# Patient Record
Sex: Female | Born: 1965
Health system: Southern US, Community
[De-identification: ages and names within clinical notes are randomized; demographics above are authoritative.]

## PROBLEM LIST (undated history)

## (undated) DIAGNOSIS — N920 Excessive and frequent menstruation with regular cycle: Secondary | ICD-10-CM

## (undated) DIAGNOSIS — D259 Leiomyoma of uterus, unspecified: Secondary | ICD-10-CM

## (undated) DIAGNOSIS — N946 Dysmenorrhea, unspecified: Secondary | ICD-10-CM

## (undated) HISTORY — DX: Leiomyoma of uterus, unspecified: D25.9

## (undated) HISTORY — DX: Dysmenorrhea, unspecified: N94.6

## (undated) HISTORY — DX: Excessive and frequent menstruation with regular cycle: N92.0

## (undated) HISTORY — PX: VAGINAL HYSTERECTOMY: SUR661

---

## 1998-09-17 ENCOUNTER — Other Ambulatory Visit: Admission: RE | Admit: 1998-09-17 | Discharge: 1998-09-17 | Payer: Self-pay | Admitting: Obstetrics and Gynecology

## 1999-05-13 ENCOUNTER — Other Ambulatory Visit: Admission: RE | Admit: 1999-05-13 | Discharge: 1999-05-13 | Payer: Self-pay | Admitting: Obstetrics and Gynecology

## 2000-02-15 ENCOUNTER — Other Ambulatory Visit: Admission: RE | Admit: 2000-02-15 | Discharge: 2000-02-15 | Payer: Self-pay | Admitting: Obstetrics and Gynecology

## 2000-12-12 ENCOUNTER — Other Ambulatory Visit: Admission: RE | Admit: 2000-12-12 | Discharge: 2000-12-12 | Payer: Self-pay | Admitting: Obstetrics and Gynecology

## 2001-11-22 ENCOUNTER — Encounter: Payer: Self-pay | Admitting: Family Medicine

## 2001-11-22 ENCOUNTER — Encounter: Admission: RE | Admit: 2001-11-22 | Discharge: 2001-11-22 | Payer: Self-pay | Admitting: Family Medicine

## 2001-12-21 ENCOUNTER — Other Ambulatory Visit: Admission: RE | Admit: 2001-12-21 | Discharge: 2001-12-21 | Payer: Self-pay | Admitting: Obstetrics and Gynecology

## 2002-11-09 ENCOUNTER — Ambulatory Visit (HOSPITAL_COMMUNITY): Admission: RE | Admit: 2002-11-09 | Discharge: 2002-11-09 | Payer: Self-pay | Admitting: Obstetrics and Gynecology

## 2002-11-09 ENCOUNTER — Encounter: Payer: Self-pay | Admitting: Obstetrics and Gynecology

## 2002-11-17 ENCOUNTER — Encounter: Payer: Self-pay | Admitting: Obstetrics and Gynecology

## 2002-11-17 ENCOUNTER — Ambulatory Visit (HOSPITAL_COMMUNITY): Admission: RE | Admit: 2002-11-17 | Discharge: 2002-11-17 | Payer: Self-pay | Admitting: Obstetrics and Gynecology

## 2003-03-05 ENCOUNTER — Observation Stay (HOSPITAL_COMMUNITY): Admission: RE | Admit: 2003-03-05 | Discharge: 2003-03-06 | Payer: Self-pay | Admitting: Interventional Radiology

## 2003-06-20 ENCOUNTER — Encounter: Admission: RE | Admit: 2003-06-20 | Discharge: 2003-06-20 | Payer: Self-pay | Admitting: Interventional Radiology

## 2003-08-21 ENCOUNTER — Other Ambulatory Visit: Admission: RE | Admit: 2003-08-21 | Discharge: 2003-08-21 | Payer: Self-pay | Admitting: Obstetrics and Gynecology

## 2003-08-28 ENCOUNTER — Encounter: Admission: RE | Admit: 2003-08-28 | Discharge: 2003-08-28 | Payer: Self-pay | Admitting: Obstetrics and Gynecology

## 2003-09-02 ENCOUNTER — Ambulatory Visit (HOSPITAL_COMMUNITY): Admission: RE | Admit: 2003-09-02 | Discharge: 2003-09-02 | Payer: Self-pay | Admitting: Interventional Radiology

## 2003-09-12 ENCOUNTER — Encounter: Admission: RE | Admit: 2003-09-12 | Discharge: 2003-09-12 | Payer: Self-pay | Admitting: Interventional Radiology

## 2004-07-09 ENCOUNTER — Other Ambulatory Visit: Admission: RE | Admit: 2004-07-09 | Discharge: 2004-07-09 | Payer: Self-pay | Admitting: Obstetrics and Gynecology

## 2004-10-12 ENCOUNTER — Encounter: Admission: RE | Admit: 2004-10-12 | Discharge: 2004-10-12 | Payer: Self-pay | Admitting: Obstetrics and Gynecology

## 2005-07-19 ENCOUNTER — Other Ambulatory Visit: Admission: RE | Admit: 2005-07-19 | Discharge: 2005-07-19 | Payer: Self-pay | Admitting: Obstetrics and Gynecology

## 2005-10-13 ENCOUNTER — Encounter: Admission: RE | Admit: 2005-10-13 | Discharge: 2005-10-13 | Payer: Self-pay | Admitting: Obstetrics and Gynecology

## 2006-02-01 ENCOUNTER — Encounter (INDEPENDENT_AMBULATORY_CARE_PROVIDER_SITE_OTHER): Payer: Self-pay | Admitting: *Deleted

## 2006-02-01 ENCOUNTER — Ambulatory Visit (HOSPITAL_COMMUNITY): Admission: RE | Admit: 2006-02-01 | Discharge: 2006-02-02 | Payer: Self-pay | Admitting: Obstetrics and Gynecology

## 2006-11-17 ENCOUNTER — Encounter: Admission: RE | Admit: 2006-11-17 | Discharge: 2006-11-17 | Payer: Self-pay | Admitting: Obstetrics and Gynecology

## 2008-04-03 ENCOUNTER — Encounter: Admission: RE | Admit: 2008-04-03 | Discharge: 2008-04-03 | Payer: Self-pay | Admitting: Obstetrics and Gynecology

## 2009-04-04 ENCOUNTER — Encounter: Admission: RE | Admit: 2009-04-04 | Discharge: 2009-04-04 | Payer: Self-pay | Admitting: Obstetrics and Gynecology

## 2010-07-03 NOTE — Op Note (Signed)
NAMESCHYLER, Patterson            ACCOUNT NO.:  [id number]   MEDICAL RECORD NO.:  [id number]          PATIENT TYPE:  AMB   LOCATION:  SDC                           FACILITY:  [facility]   PHYSICIAN:  Janine Limbo, M.D.DATE OF BIRTH:  [birth year]   DATE OF PROCEDURE:  02/01/2006  DATE OF DISCHARGE:                               OPERATIVE REPORT   PREOPERATIVE DIAGNOSES:  1. Fibroid uterus.  2. Menorrhagia  3. Dysmenorrhea.   POSTOPERATIVE DIAGNOSES:  1. Fibroid uterus.  2. Menorrhagia  3. Dysmenorrhea.   PROCEDURES:  1. Vaginal hysterectomy.  2. Uterine morcellation.   SURGEON:  Janine Limbo, M.D.   FIRST ASSISTANT:  Osborn Coho, MD.   ANESTHETIC:  General.   DISPOSITION:  Amy Patterson is a 46 year old female, para 1-1-0-2, who  presents with the above-mentioned diagnoses.  She understands her  indications for a vaginal hysterectomy and she accepts the risks of, but  not limited to, anesthetic complications, bleeding, infection, and  possible damage to the surrounding organs.   FINDINGS:  The patient was noted to have a 16-week size fibroid uterus  and there was a predominant fibroid located on the right anterior fundus  that measured approximately 10 cm in size.  The fallopian tubes were  normal except for defect from her prior tubal ligation.  The ovaries  appeared normal.   PROCEDURE:  The patient was taken to the operating room, where a general  anesthetic was given.  The patient's abdomen, perineum, and vagina were  prepped with multiple layers of Betadine.  Examination under anesthesia  was performed.  A Foley catheter was placed in the bladder.  The patient  was sterilely draped.  The cervix was then injected with a diluted  solution of Pitressin and saline.  A circumferential incision was made  around the cervix and the vaginal mucosa was advanced anteriorly and  posteriorly.  The posterior cul-de-sac and then the anterior cul-de-sac  were sharply  entered.  Alternating from right to left, the uterosacral  ligaments, paracervical tissues, parametrial tissues, and the uterine  arteries were clamped, cut, sutured, and tied securely.  We were able to  totally isolate the left upper pedicle.  This was clamped and cut.  The  upper pedicle was sutured and held to the side.  We were able to advance  the suture line to the level of the right anterior fundus of the uterus  when we encountered the large fibroid.  Attempts were made to invert the  fibroid through the posterior colpotomy.  These attempts were  unsuccessful.  We then began to morcellate the uterus using sharp  dissection.  Multiple sections of the fibroid were removed over a  approximately a 1-hour period of time.  We were then able to invert the  uterus through the posterior colpotomy.  The remainder of the right  upper pedicle was then secured with Heaney clamps.  The pedicle was  clamped and cut.  The upper pedicle was then free tie tied and then  suture ligated.  Bleeding was noted from the posterior cuff of the  vagina, and hemostasis  was achieved using figure-of-eight sutures.  The  sutures attached to the uterosacral ligaments were brought out through  the vaginal angles and then tied securely.  A McCall culdoplasty suture  was placed in the posterior cul-de-sac incorporating the uterosacral  ligaments bilaterally and the posterior peritoneum.  Additional bleeding  was noted from the posterior cuff and the anterior cuff.  Figure-of-  eight sutures were used for hemostasis.  At this point we felt that  hemostasis was adequate.  The vaginal cuff was then closed using figure-  of-eight sutures incorporating the anterior vaginal mucosa, the anterior  peritoneum, the posterior peritoneum, and the posterior vaginal mucosa.  Hemostasis was adequate throughout.  The McCall culdoplasty suture was  tied securely and the apex of the vagina was noted to elevate into the  mid pelvis.   The sponge, needle, and instrument counts were correct on  two occasions.  The estimated blood loss for the procedure was 150 mL.  The patient had 350 mL of clear urine output during the operative  procedure.  She received 2 L of IV fluid.  Vicryl 0 was the suture  material used throughout the procedure except where otherwise mentioned.  The patient was awakened from her anesthetic without difficulty and  taken to the recovery room in stable condition.  The uterus and the  multiple portions of the fibroid were sent to pathology for evaluation.      Janine Limbo, M.D.  Electronically Signed     AVS/MEDQ  D:  02/01/2006  T:  02/01/2006  Job:  [id number]

## 2010-07-03 NOTE — H&P (Signed)
Amy Patterson, Amy Patterson            ACCOUNT NO.:  [id number]   MEDICAL RECORD NO.:  [id number]          PATIENT TYPE:  AMB   LOCATION:  SDC                           [facility]ILITY:  [facility]   PHYSICIAN:  Janine Limbo, M.D.DATE OF BIRTH:  1965/05/06   DATE OF ADMISSION:  02/01/2006  DATE OF DISCHARGE:                              HISTORY & PHYSICAL   HISTORY OF PRESENT ILLNESS:  Amy Patterson is a 45 year old female, P1-1-  0-2 who presents for a vaginal hysterectomy (possible total abdominal  hysterectomy).  The patient has been followed at the [facility].  The patient has known fibroids that are very large.  An ultrasound was  performed that showed the uterus to be 9.4 x 9.0 cm in size, but  multiple fibroids were noted with the largest measuring 7.47 cm.  The  ovaries appeared normal.  An endometrial biopsy was performed and it was  noted to be within normal limits.  The patient's most recent Pap smear  was also within normal limits.  The patient has had a prior uterine  artery embolization.  Her fibroids continued to grow and she continued  to have discomfort.  The patient has been treated with Depo-Lupron 11.25  mg.  She was noted to have cessation of her menses.  Her fibroids were  noted to shrink slightly from the previous size.  The patient also  complains of menorrhagia.  The patient had a gonorrhea and Chlamydia  culture of her cervix, both of which were negative.   PAST OBSTETRICAL HISTORY:  The patient has had one term vaginal  delivery, one preterm vaginal delivery, one miscarriage and one elective  pregnancy termination.   ALLERGIES:  No known drug allergies.   PAST MEDICAL HISTORY:  The patient denies hypertension and diabetes.   SOCIAL HISTORY:  The patient is a Solicitor at [employer].  She is currently married.  She smokes 1/2 pack of cigarettes each day.  She drinks alcohol  socially.  She denies any other recreational drug  uses.   REVIEW OF SYSTEMS:  The patient has a past history of depression, but is  doing quite well at this point.   FAMILY HISTORY:  The patient's maternal grandmother had diabetes.  Her  paternal grandmother had a stroke.  Her paternal grandmother had a  cancer of unknown etiology.   PHYSICAL EXAMINATION:  VITAL SIGNS:  Weight 147 pounds, height 5 feet 2  inches.  HEENT:  Within normal limits.  CHEST:  Clear.  HEART:  Regular rate and rhythm.  BREASTS:  Without masses.  ABDOMEN:  Soft and you can feel a firm mass in the lower pelvis.  EXTREMITIES:  Within normal limits.  NEUROLOGIC:  Grossly normal.  PELVIC:  External genitalia normal.  The vagina is normal.  The cervix  is nontender and no lesions are appreciated.  Uterus is 12- to 14-week  size and irregular.  Adnexa with no masses appreciated.  Rectovaginal  exam confirms.   ASSESSMENT:  1. Fibroid uterus.  2. Menorrhagia.  3. Cigarette  smoker.   PLAN:  The patient will undergo a vaginal hysterectomy.  She understands  that there is a possibility that we may need to perform an abdominal  hysterectomy.  She understands the indications for her surgical  procedure and she accepts the risk of, but not limited to anesthetic  complications, bleeding, infections and possible damage to the  surrounding organs.      Janine Limbo, M.D.  Electronically Signed     AVS/MEDQ  D:  01/31/2006  T:  01/31/2006  Job:  [id number]

## 2011-04-28 ENCOUNTER — Other Ambulatory Visit: Payer: Self-pay | Admitting: Obstetrics and Gynecology

## 2011-04-28 DIAGNOSIS — Z1231 Encounter for screening mammogram for malignant neoplasm of breast: Secondary | ICD-10-CM

## 2011-05-10 ENCOUNTER — Ambulatory Visit: Payer: Self-pay

## 2011-10-25 ENCOUNTER — Ambulatory Visit
Admission: RE | Admit: 2011-10-25 | Discharge: 2011-10-25 | Disposition: A | Discharge status: Home / Self Care | Payer: 59 | Source: Ambulatory Visit | Attending: Obstetrics and Gynecology | Admitting: Obstetrics and Gynecology

## 2011-10-25 DIAGNOSIS — Z1231 Encounter for screening mammogram for malignant neoplasm of breast: Secondary | ICD-10-CM

## 2011-12-14 ENCOUNTER — Encounter: Payer: Self-pay | Admitting: Obstetrics and Gynecology

## 2011-12-14 ENCOUNTER — Ambulatory Visit (INDEPENDENT_AMBULATORY_CARE_PROVIDER_SITE_OTHER): Payer: 59 | Admitting: Obstetrics and Gynecology

## 2011-12-14 VITALS — BP 130/84 | Ht 62.5 in | Wt 143.0 lb

## 2011-12-14 DIAGNOSIS — N946 Dysmenorrhea, unspecified: Secondary | ICD-10-CM | POA: Insufficient documentation

## 2011-12-14 DIAGNOSIS — Z01419 Encounter for gynecological examination (general) (routine) without abnormal findings: Secondary | ICD-10-CM

## 2011-12-14 DIAGNOSIS — D259 Leiomyoma of uterus, unspecified: Secondary | ICD-10-CM | POA: Insufficient documentation

## 2011-12-14 DIAGNOSIS — N920 Excessive and frequent menstruation with regular cycle: Secondary | ICD-10-CM | POA: Insufficient documentation

## 2011-12-14 NOTE — Progress Notes (Signed)
Subjective:    Amy Patterson is a 46 y.o. female G4P2 who presents for annual exam. The patient complaints of menopausal symptoms. She is status post hysterectomy.  She is a cigarette smoker.  The following portions of the patient's history were reviewed and updated as appropriate: allergies, current medications, past family history, past medical history, past social history, past surgical history and problem list.  Review of Systems Pertinent items are noted in HPI. Gastrointestinal:No change in bowel habits, no abdominal pain, no rectal bleeding Genitourinary:negative for dysuria, frequency, hematuria, nocturia and urinary incontinence    Objective:     BP 130/84  Ht 5' 2.5" (1.588 m)  Wt 143 lb (64.864 kg)  BMI 25.74 kg/m2  LMP 11/29/2005  Weight:  Wt Readings from Last 1 Encounters:  12/14/11 143 lb (64.864 kg)     BMI: Body mass index is 25.74 kg/(m^2). General Appearance: Alert, appropriate appearance for age. No acute distress HEENT: Grossly normal Neck / Thyroid: Supple, no masses, nodes or enlargement Lungs: clear to auscultation bilaterally Back: No CVA tenderness Breast Exam: No masses or nodes.No dimpling, nipple retraction or discharge. Cardiovascular: Regular rate and rhythm. S1, S2, no murmur Gastrointestinal: Soft, non-tender, no masses or organomegaly  ++++++++++++++++++++++++++++++++++++++++++++++++++++++++  Pelvic Exam: External genitalia: normal general appearance Vaginal: normal without tenderness, induration or masses and Relaxation noted Cervix: absent Adnexa: normal bimanual exam Uterus: absent Rectovaginal: normal rectal, no masses  ++++++++++++++++++++++++++++++++++++++++++++++++++++++++  Lymphatic Exam: Non-palpable nodes in neck, clavicular, axillary, or inguinal regions  Psychiatric: Alert and oriented, appropriate affect.      Assessment:    Normal gyn exam   Overweight or obese: No  Pelvic relaxation: Yes  Menopausal symptoms:  Yes. Severe: Yes.  Cigarette smoker.   Plan:    Mammogram.   Smoking cessation discussed.  Follow-up:  for annual exam  The updated Pap smear screening guidelines were discussed with the patient. The patient requested that I obtain a Pap smear: No.  Kegel exercises discussed: Yes.  Proper diet and regular exercise were reviewed.  Annual mammograms recommended starting at age 91. Proper breast care was discussed.  Screening colonoscopy is recommended beginning at age 28.  Regular health maintenance was reviewed.  Sleep hygiene was discussed.  Adequate calcium and vitamin D intake was emphasized.  Leonard Schwartz M.D.   Regular Periods: no Mammogram: yes  Monthly Breast Ex.: yes Exercise: no  Tetanus < 10 years: yes Seatbelts: yes  NI. Bladder Functn.: yes Abuse at home: no  Daily BM's: yes Stressful Work: no  Healthy Diet: yes Sigmoid-Colonoscopy: n/a  Calcium: no Medical problems this year: none   LAST PAP: 07/27/2007  Contraception: Hysterectomy  Mammogram:  10/25/2011  PCP:  [facility]  PMH:  None  FMH:  None  Last Bone Scan:  None

## 2012-11-02 ENCOUNTER — Other Ambulatory Visit: Payer: Self-pay

## 2012-11-02 DIAGNOSIS — Z1231 Encounter for screening mammogram for malignant neoplasm of breast: Secondary | ICD-10-CM

## 2012-11-22 ENCOUNTER — Ambulatory Visit
Admission: RE | Admit: 2012-11-22 | Discharge: 2012-11-22 | Disposition: A | Discharge status: Home / Self Care | Payer: 59 | Source: Ambulatory Visit

## 2012-11-22 DIAGNOSIS — Z1231 Encounter for screening mammogram for malignant neoplasm of breast: Secondary | ICD-10-CM

## 2013-12-04 ENCOUNTER — Other Ambulatory Visit: Payer: Self-pay

## 2013-12-04 DIAGNOSIS — Z1231 Encounter for screening mammogram for malignant neoplasm of breast: Secondary | ICD-10-CM

## 2013-12-17 ENCOUNTER — Encounter: Payer: Self-pay | Admitting: Obstetrics and Gynecology

## 2013-12-27 ENCOUNTER — Ambulatory Visit
Admission: RE | Admit: 2013-12-27 | Discharge: 2013-12-27 | Disposition: A | Discharge status: Home / Self Care | Payer: 59 | Source: Ambulatory Visit

## 2013-12-27 DIAGNOSIS — Z1231 Encounter for screening mammogram for malignant neoplasm of breast: Secondary | ICD-10-CM

## 2015-03-25 ENCOUNTER — Other Ambulatory Visit: Payer: Self-pay

## 2015-03-25 DIAGNOSIS — Z1231 Encounter for screening mammogram for malignant neoplasm of breast: Secondary | ICD-10-CM

## 2015-04-15 ENCOUNTER — Ambulatory Visit
Admission: RE | Admit: 2015-04-15 | Discharge: 2015-04-15 | Disposition: A | Discharge status: Home / Self Care | Payer: 59 | Source: Ambulatory Visit

## 2015-04-15 DIAGNOSIS — Z1231 Encounter for screening mammogram for malignant neoplasm of breast: Secondary | ICD-10-CM

## 2016-03-24 DIAGNOSIS — E785 Hyperlipidemia, unspecified: Secondary | ICD-10-CM | POA: Diagnosis not present

## 2016-03-24 DIAGNOSIS — Z0000 Encounter for general adult medical examination without abnormal findings: Secondary | ICD-10-CM | POA: Diagnosis not present

## 2016-03-24 DIAGNOSIS — E538 Deficiency of other specified B group vitamins: Secondary | ICD-10-CM | POA: Diagnosis not present

## 2016-03-24 DIAGNOSIS — E559 Vitamin D deficiency, unspecified: Secondary | ICD-10-CM | POA: Diagnosis not present

## 2016-03-24 DIAGNOSIS — I10 Essential (primary) hypertension: Secondary | ICD-10-CM | POA: Diagnosis not present

## 2016-05-03 ENCOUNTER — Other Ambulatory Visit: Payer: Self-pay | Admitting: Family Medicine

## 2016-05-03 ENCOUNTER — Ambulatory Visit
Admission: RE | Admit: 2016-05-03 | Discharge: 2016-05-03 | Disposition: A | Discharge status: Home / Self Care | Payer: 59 | Source: Ambulatory Visit | Attending: Family Medicine | Admitting: Family Medicine

## 2016-05-03 DIAGNOSIS — Z1231 Encounter for screening mammogram for malignant neoplasm of breast: Secondary | ICD-10-CM

## 2016-07-21 DIAGNOSIS — N951 Menopausal and female climacteric states: Secondary | ICD-10-CM | POA: Diagnosis not present

## 2016-07-21 DIAGNOSIS — Z01411 Encounter for gynecological examination (general) (routine) with abnormal findings: Secondary | ICD-10-CM | POA: Diagnosis not present

## 2016-12-09 DIAGNOSIS — H2513 Age-related nuclear cataract, bilateral: Secondary | ICD-10-CM | POA: Diagnosis not present

## 2016-12-09 DIAGNOSIS — H35033 Hypertensive retinopathy, bilateral: Secondary | ICD-10-CM | POA: Diagnosis not present

## 2016-12-09 DIAGNOSIS — H40013 Open angle with borderline findings, low risk, bilateral: Secondary | ICD-10-CM | POA: Diagnosis not present

## 2016-12-29 DIAGNOSIS — R229 Localized swelling, mass and lump, unspecified: Secondary | ICD-10-CM | POA: Diagnosis not present

## 2017-04-04 DIAGNOSIS — E785 Hyperlipidemia, unspecified: Secondary | ICD-10-CM | POA: Diagnosis not present

## 2017-04-04 DIAGNOSIS — Z0000 Encounter for general adult medical examination without abnormal findings: Secondary | ICD-10-CM | POA: Diagnosis not present

## 2017-04-04 DIAGNOSIS — I10 Essential (primary) hypertension: Secondary | ICD-10-CM | POA: Diagnosis not present

## 2017-05-02 ENCOUNTER — Other Ambulatory Visit: Payer: Self-pay | Admitting: Family Medicine

## 2017-05-02 DIAGNOSIS — Z1231 Encounter for screening mammogram for malignant neoplasm of breast: Secondary | ICD-10-CM

## 2017-05-19 ENCOUNTER — Ambulatory Visit
Admission: RE | Admit: 2017-05-19 | Discharge: 2017-05-19 | Disposition: A | Discharge status: Home / Self Care | Payer: 59 | Source: Ambulatory Visit | Attending: Family Medicine | Admitting: Family Medicine

## 2017-05-19 DIAGNOSIS — Z1231 Encounter for screening mammogram for malignant neoplasm of breast: Secondary | ICD-10-CM

## 2017-07-25 DIAGNOSIS — Z6828 Body mass index (BMI) 28.0-28.9, adult: Secondary | ICD-10-CM | POA: Diagnosis not present

## 2017-07-25 DIAGNOSIS — Z01411 Encounter for gynecological examination (general) (routine) with abnormal findings: Secondary | ICD-10-CM | POA: Diagnosis not present

## 2017-12-21 DIAGNOSIS — H2513 Age-related nuclear cataract, bilateral: Secondary | ICD-10-CM | POA: Diagnosis not present

## 2017-12-21 DIAGNOSIS — H40013 Open angle with borderline findings, low risk, bilateral: Secondary | ICD-10-CM | POA: Diagnosis not present

## 2017-12-21 DIAGNOSIS — H35033 Hypertensive retinopathy, bilateral: Secondary | ICD-10-CM | POA: Diagnosis not present

## 2018-04-20 ENCOUNTER — Other Ambulatory Visit: Payer: Self-pay | Admitting: Family Medicine

## 2018-04-20 DIAGNOSIS — Z1231 Encounter for screening mammogram for malignant neoplasm of breast: Secondary | ICD-10-CM

## 2018-05-02 DIAGNOSIS — E785 Hyperlipidemia, unspecified: Secondary | ICD-10-CM | POA: Diagnosis not present

## 2018-05-02 DIAGNOSIS — Z0000 Encounter for general adult medical examination without abnormal findings: Secondary | ICD-10-CM | POA: Diagnosis not present

## 2018-05-04 DIAGNOSIS — Z1211 Encounter for screening for malignant neoplasm of colon: Secondary | ICD-10-CM | POA: Diagnosis not present

## 2018-05-23 ENCOUNTER — Ambulatory Visit: Payer: 59

## 2018-08-02 ENCOUNTER — Ambulatory Visit
Admission: RE | Admit: 2018-08-02 | Discharge: 2018-08-02 | Disposition: A | Discharge status: Home / Self Care | Payer: 59 | Source: Ambulatory Visit | Attending: Family Medicine | Admitting: Family Medicine

## 2018-08-02 ENCOUNTER — Other Ambulatory Visit: Payer: Self-pay

## 2018-08-02 DIAGNOSIS — Z1231 Encounter for screening mammogram for malignant neoplasm of breast: Secondary | ICD-10-CM

## 2019-09-06 ENCOUNTER — Other Ambulatory Visit: Payer: Self-pay | Admitting: Family Medicine

## 2019-09-06 DIAGNOSIS — Z1231 Encounter for screening mammogram for malignant neoplasm of breast: Secondary | ICD-10-CM

## 2019-09-12 ENCOUNTER — Other Ambulatory Visit: Payer: Self-pay

## 2019-09-12 ENCOUNTER — Ambulatory Visit
Admission: RE | Admit: 2019-09-12 | Discharge: 2019-09-12 | Disposition: A | Discharge status: Home / Self Care | Payer: 59 | Source: Ambulatory Visit | Attending: Family Medicine | Admitting: Family Medicine

## 2019-09-12 DIAGNOSIS — Z1231 Encounter for screening mammogram for malignant neoplasm of breast: Secondary | ICD-10-CM

## 2019-12-24 ENCOUNTER — Other Ambulatory Visit: Payer: Self-pay

## 2019-12-24 ENCOUNTER — Ambulatory Visit (INDEPENDENT_AMBULATORY_CARE_PROVIDER_SITE_OTHER): Payer: 59

## 2019-12-24 ENCOUNTER — Ambulatory Visit
Admission: RE | Admit: 2019-12-24 | Discharge: 2019-12-24 | Disposition: A | Discharge status: Home / Self Care | Payer: 59 | Source: Ambulatory Visit | Attending: Emergency Medicine | Admitting: Emergency Medicine

## 2019-12-24 VITALS — BP 146/96 | HR 92 | Temp 98.7°F | Resp 16

## 2019-12-24 DIAGNOSIS — S60212A Contusion of left wrist, initial encounter: Secondary | ICD-10-CM

## 2019-12-24 DIAGNOSIS — M25532 Pain in left wrist: Secondary | ICD-10-CM | POA: Diagnosis not present

## 2019-12-24 NOTE — ED Triage Notes (Signed)
Pt c/o lt wrist pain x3wks ago with no injury. States wearing a wrist brace, heat/ice, and NSAIDS. States yesterday hit lt wrist on her grandson's toy and now having more pain with swelling and bruising.

## 2019-12-24 NOTE — Discharge Instructions (Addendum)

## 2019-12-24 NOTE — ED Provider Notes (Signed)
[facility]    CSN: [id number] Arrival date & time: 12/24/19  1443      History   Chief Complaint Chief Complaint  Patient presents with  . Wrist Pain    HPI Amy Patterson is a 54 y.o. female  Resenting for 3-week course of left wrist pain.  Has been wearing wrist brace, using Tylenol, heat and ice with some relief.  States she hit it yesterday against one of her grandsons toys which caused more pain, prompting her to seek evaluation today.  Past Medical History:  Diagnosis Date  . Dysmenorrhea   . Fibroid uterus   . Menorrhagia     Patient Active Problem List   Diagnosis Date Noted  . Fibroid uterus   . Menorrhagia   . Dysmenorrhea     Past Surgical History:  Procedure Laterality Date  . VAGINAL HYSTERECTOMY      OB History    Gravida  4   Para  2   Term      Preterm      AB      Living  2     SAB      TAB      Ectopic      Multiple      Live Births               Home Medications    Prior to Admission medications   Not on File    Family History Family History  Problem Relation Age of Onset  . Breast cancer Neg Hx     Social History Social History   Tobacco Use  . Smoking status: Current Every Day Smoker    Types: Cigarettes  . Smokeless tobacco: Never Used  Substance Use Topics  . Alcohol use: No  . Drug use: No     Allergies   Patient has no known allergies.   Review of Systems Review of Systems  Constitutional: Negative for fatigue and fever.  Respiratory: Negative for cough and shortness of breath.   Cardiovascular: Negative for chest pain and palpitations.  Musculoskeletal: Negative for neck pain.       Positive for L wrist pain  Neurological: Negative for weakness and numbness.     Physical Exam Triage Vital Signs ED Triage Vitals  Enc Vitals Group     BP      Pulse      Resp      Temp      Temp src      SpO2      Weight      Height      Head Circumference      Peak Flow       Pain Score      Pain Loc      Pain Edu?      Excl. in [place]?    No data found.  Updated Vital Signs BP (!) 146/96 (BP Location: Left Arm)   Pulse 92   Temp 98.7 F (37.1 C) (Oral)   Resp 16   LMP 11/29/2005   SpO2 98%   Visual Acuity Right Eye Distance:   Left Eye Distance:   Bilateral Distance:    Right Eye Near:   Left Eye Near:    Bilateral Near:     Physical Exam Constitutional:      General: She is not in acute distress. HENT:     Head: Normocephalic and atraumatic.  Eyes:     General: No  scleral icterus.    Pupils: Pupils are equal, round, and reactive to light.  Cardiovascular:     Rate and Rhythm: Normal rate.  Pulmonary:     Effort: Pulmonary effort is normal.  Musculoskeletal:        General: Tenderness present. No swelling. Normal range of motion.     Comments: LEFT distal radial head tenderness without bony deformity.  Negative snuffbox tenderness.  Positive Finkelstein's test, negative Tinel's or Phalen's test.  Neurovascular intact  Skin:    Coloration: Skin is not jaundiced or pale.  Neurological:     Mental Status: She is alert and oriented to person, place, and time.      UC Treatments / Results  Labs (all labs ordered are listed, but only abnormal results are displayed) Labs Reviewed - No data to display  EKG   Radiology DG Wrist Complete Left  Result Date: 12/24/2019 CLINICAL DATA:  54 year old female with left wrist pain and dorsal bruising after blunt trauma on child stool oil. EXAM: LEFT WRIST - COMPLETE 3+ VIEW COMPARISON:  None. FINDINGS: The bone mineralization at the wrist is within normal limits. There is benign-appearing ground-glass bone mineralization involving some of the metacarpals (especially the 2nd metacarpal) raising the possibility of fibrous dysplasia of bone. Distal radius and ulna appear intact. Carpal bones appear intact and normally aligned. Scapholunate interval at the upper limits of normal. No fracture or  dislocation identified. No discrete soft tissue injury. IMPRESSION: No acute fracture or dislocation identified about the left wrist. Possible benign metacarpal fibrous dysplasia. Electronically Signed   By: Genevie Ann M.D.   On: 12/24/2019 15:49    Procedures Procedures (including critical care time)  Medications Ordered in UC Medications - No data to display  Initial Impression / Assessment and Plan / UC Course  I have reviewed the triage vital signs and the nursing notes.  Pertinent labs & imaging results that were available during my care of the patient were reviewed by me and considered in my medical decision making (see chart for details).     Chair without acute fracture or dislocation, possible benign metacarpal fibrous dysplasia.  Will continue wearing wrist brace, follow-up with orthopedics for further evaluation and management.  Active supportive care as below in the interim.  Return precautions discussed, pt verbalized understanding and is agreeable to plan. Final Clinical Impressions(s) / UC Diagnoses   Final diagnoses:  Left wrist pain     Discharge Instructions     RICE: rest, ice, compression, elevation as needed for pain.    Pain medication:  350 mg-1000 mg of Tylenol (acetaminophen) and/or 200 mg - 800 mg of Advil (ibuprofen, Motrin) every 8 hours as needed.  May alternate between the two throughout the day as they are generally safe to take together.  DO NOT exceed more than 3000 mg of Tylenol or 3200 mg of ibuprofen in a 24 hour period as this could damage your stomach, kidneys, liver, or increase your bleeding risk.  Important to follow up with specialist(s) below for further evaluation/management if your symptoms persist or worsen.    ED Prescriptions    None     PDMP not reviewed this encounter.   [place], [place], [place] 12/24/19 1617

## 2020-10-24 ENCOUNTER — Other Ambulatory Visit: Payer: Self-pay | Admitting: Family Medicine

## 2020-10-24 DIAGNOSIS — Z1231 Encounter for screening mammogram for malignant neoplasm of breast: Secondary | ICD-10-CM

## 2020-10-27 ENCOUNTER — Ambulatory Visit
Admission: RE | Admit: 2020-10-27 | Discharge: 2020-10-27 | Disposition: A | Discharge status: Home / Self Care | Payer: 59 | Source: Ambulatory Visit | Attending: Family Medicine | Admitting: Family Medicine

## 2020-10-27 ENCOUNTER — Other Ambulatory Visit: Payer: Self-pay

## 2020-10-27 DIAGNOSIS — Z1231 Encounter for screening mammogram for malignant neoplasm of breast: Secondary | ICD-10-CM

## 2021-01-14 ENCOUNTER — Other Ambulatory Visit: Payer: Self-pay | Admitting: Physical Medicine and Rehabilitation

## 2021-01-14 ENCOUNTER — Ambulatory Visit
Admission: RE | Admit: 2021-01-14 | Discharge: 2021-01-14 | Disposition: A | Discharge status: Home / Self Care | Payer: 59 | Source: Ambulatory Visit | Attending: Physical Medicine and Rehabilitation | Admitting: Physical Medicine and Rehabilitation

## 2021-01-14 DIAGNOSIS — R051 Acute cough: Secondary | ICD-10-CM

## 2021-05-06 ENCOUNTER — Other Ambulatory Visit: Payer: Self-pay | Admitting: Family Medicine

## 2021-05-06 ENCOUNTER — Ambulatory Visit
Admission: RE | Admit: 2021-05-06 | Discharge: 2021-05-06 | Disposition: A | Discharge status: Home / Self Care | Payer: 59 | Source: Ambulatory Visit | Attending: Family Medicine | Admitting: Family Medicine

## 2021-05-06 DIAGNOSIS — Z8701 Personal history of pneumonia (recurrent): Secondary | ICD-10-CM

## 2021-06-23 ENCOUNTER — Other Ambulatory Visit (HOSPITAL_BASED_OUTPATIENT_CLINIC_OR_DEPARTMENT_OTHER): Payer: Self-pay | Admitting: Family Medicine

## 2021-06-23 DIAGNOSIS — R9389 Abnormal findings on diagnostic imaging of other specified body structures: Secondary | ICD-10-CM

## 2021-06-24 ENCOUNTER — Ambulatory Visit (HOSPITAL_BASED_OUTPATIENT_CLINIC_OR_DEPARTMENT_OTHER)
Admission: RE | Admit: 2021-06-24 | Discharge: 2021-06-24 | Disposition: A | Discharge status: Home / Self Care | Payer: 59 | Source: Ambulatory Visit | Attending: Family Medicine | Admitting: Family Medicine

## 2021-06-24 DIAGNOSIS — R9389 Abnormal findings on diagnostic imaging of other specified body structures: Secondary | ICD-10-CM | POA: Insufficient documentation

## 2021-06-24 MED ORDER — IOHEXOL 300 MG/ML  SOLN
80.0000 mL | Freq: Once | INTRAMUSCULAR | Status: AC | PRN
  Administered 2021-06-24: 80 mL via INTRAVENOUS

## 2021-07-22 ENCOUNTER — Telehealth: Payer: Self-pay

## 2021-07-22 NOTE — Telephone Encounter (Signed)
NOTES SCANNED TO REFERRAL 

## 2021-07-29 ENCOUNTER — Ambulatory Visit (INDEPENDENT_AMBULATORY_CARE_PROVIDER_SITE_OTHER): Payer: 59 | Admitting: Internal Medicine

## 2021-07-29 ENCOUNTER — Encounter: Payer: Self-pay | Admitting: Internal Medicine

## 2021-07-29 VITALS — BP 140/96 | HR 71 | Ht 62.5 in | Wt 145.4 lb

## 2021-07-29 DIAGNOSIS — Z01810 Encounter for preprocedural cardiovascular examination: Secondary | ICD-10-CM | POA: Diagnosis not present

## 2021-07-29 DIAGNOSIS — E785 Hyperlipidemia, unspecified: Secondary | ICD-10-CM

## 2021-07-29 MED ORDER — ATORVASTATIN CALCIUM 20 MG PO TABS: 20.0000 mg | ORAL_TABLET | Freq: Every day | ORAL | 3 refills | Status: DC

## 2021-07-29 MED ORDER — ATORVASTATIN CALCIUM 40 MG PO TABS: 40.0000 mg | ORAL_TABLET | Freq: Every day | ORAL | 3 refills | Status: DC

## 2021-07-29 MED ORDER — ASPIRIN 81 MG PO TBEC
81.0000 mg | DELAYED_RELEASE_TABLET | Freq: Every day | ORAL | 12 refills | Status: AC
Start: 2021-07-29 — End: ?

## 2021-07-29 NOTE — Patient Instructions (Addendum)
Medication Instructions:  START: ASPIRIN 81 mg once daily  INCREASE LIPITOR TO '40mg'$  ONCE DAILY   PLEASE GET NICOTINE PATCHES OVER THE COUNTER- THERE ARE STEPS 1-3 *If you need a refill on your cardiac medications before your next appointment, please call your pharmacy*  Lab Work: Please return for FASTING Blood Work in [place]. No appointment needed, lab here at the office is open Monday-Friday from 8AM to 4PM and closed daily for lunch from 12:45-1:45.   If you have labs (blood work) drawn today and your tests are completely normal, you will receive your results only by: [place] (if you have MyChart) OR A paper copy in the mail If you have any lab test that is abnormal or we need to change your treatment, we will call you to review the results.  Follow-Up: At [facility], you and your health needs are our priority.  As part of our continuing mission to provide you with exceptional heart care, we have created designated Provider Care Teams.  These Care Teams include your primary Cardiologist (physician) and Advanced Practice Providers (APPs -  Physician Assistants and Nurse Practitioners) who all work together to provide you with the care you need, when you need it.  Your next appointment:   3 month(s)  The format for your next appointment:   In Person  Provider:   Janina Mayo, MD

## 2021-07-29 NOTE — Progress Notes (Signed)
Cardiology Office Note:    Date:  07/29/2021   ID:  Amy Patterson, DOB Jul 01, 1965, MRN [id number]  PCP:  Angelina Pih, MD   [facility] Providers Cardiologist:  Janina Mayo, MD     Referring MD: Angelina Pih, MD   No chief complaint on file. CAC/Palpitations  History of Present Illness:    Amy Patterson is a 56 y.o. female with a hx of smoking since she was teen, referral for palpitations and CAC.  She notes in March she developed PNA.  She had a chest xray in March that showed RML collapse/consolidation. Follow up CT chest did not show an obstructing mass. There was minimal chronic post pneumonic medial RML atelectasis. Incidentally found to have CAC.  She does routine activities and does not have chest pressure or SOB. She felt that her heart fluttered. She used [employer] which noted sinus tachycardia. She is still smoking, working on [employer]. She notes sedentary desk job. Parents have hypertension. No premature CAD.  No cardiology visits before ,no stress testing. Blood pressures are well controlled typically < 130/80 mmhg. She is on norvasc 2.5 mg daily.    CT Chest w Contrast 06/24/2021 Cardiovascular: The heart is normal in size. No pericardial effusion. The aorta is normal in caliber. No dissection. Scattered atherosclerotic calcifications. The [place] vessels are patent. Moderate age advanced three-vessel coronary artery calcifications.  Past Medical History:  Diagnosis Date   Dysmenorrhea    Fibroid uterus    Menorrhagia     Past Surgical History:  Procedure Laterality Date   VAGINAL HYSTERECTOMY      Current Medications: Current Meds  Medication Sig   amLODipine (NORVASC) 2.5 MG tablet 1 tablet for 5 days, increase to 2 tablets if BP still over 130/80.   aspirin EC 81 MG tablet Take 1 tablet (81 mg total) by mouth daily. Swallow whole.   [DISCONTINUED] atorvastatin (LIPITOR) 10 MG tablet Take 10 mg by mouth daily.     Allergies:    Bupropion and Varenicline   Social History   Socioeconomic History   Marital status: Married    Spouse name: Not on file   Number of children: Not on file   Years of education: Not on file   Highest education level: Not on file  Occupational History   Not on file  Tobacco Use   Smoking status: Every Day    Types: Cigarettes   Smokeless tobacco: Never  Substance and Sexual Activity   Alcohol use: No   Drug use: No   Sexual activity: Yes    Birth control/protection: Surgical    Comment: HYST  Other Topics Concern   Not on file  Social History Narrative   Not on file   Social Determinants of Health   Financial Resource Strain: Not on file  Food Insecurity: Not on file  Transportation Needs: Not on file  Physical Activity: Not on file  Stress: Not on file  Social Connections: Not on file     Family History: The patient's family history is negative for Breast cancer.  ROS:   Please see the history of present illness.     All other systems reviewed and are negative.  EKGs/Labs/Other Studies Reviewed:    The following studies were reviewed today:   EKG:  EKG is  ordered today.  The ekg ordered today demonstrates   07/29/2021- NSR  , no significant ischemic changes  Recent Labs: No results found for requested labs within last 365 days.  Recent Lipid Panel No results found for: "CHOL", "TRIG", "HDL", "CHOLHDL", "VLDL", "LDLCALC", "LDLDIRECT"   Risk Assessment/Calculations:           Physical Exam:    VS:  BP (!) 140/96   Pulse 71   Ht 5' 2.5" (1.588 m)   Wt 145 lb 6.4 oz (66 kg)   LMP 11/29/2005   SpO2 97%   BMI 26.17 kg/m     Wt Readings from Last 3 Encounters:  07/29/21 145 lb 6.4 oz (66 kg)  12/14/11 143 lb (64.9 kg)     GEN:  Well nourished, well developed in no acute distress HEENT: Normal NECK: No JVD;  LYMPHATICS: No lymphadenopathy CARDIAC: RRR, no murmurs, rubs, gallops RESPIRATORY:  Clear to auscultation without rales, wheezing or  rhonchi  ABDOMEN: Soft, non-tender, non-distended MUSCULOSKELETAL:  No edema; No deformity  SKIN: Warm and dry NEUROLOGIC:  Alert and oriented x 3 PSYCHIATRIC:  Normal affect   ASSESSMENT:    Elevated CAC: Noted CAC on non-gated CT shows CAC, notable in the LAD. She is asymptomatic. We discussed symptoms of CAD including DOE and chest pressure. If these were to arise, will plan for further ischemic eval. Will institute preventive strategy for now. Start asa 81 mg daily. Increased her lipitor to 40 mg daily.  LDL 167 mg/dL  Palpitations: She does not have high risk features including syncope c/f arrhythmia , family hx of SCD, or abnormalities on her EKG. This was brief. We discussed cutting caffeine.   Smoking:  did not tolerate chantix or wellbutrin. She's tried patches before.    PLAN:    In order of problems listed above:  Recommend nicotine patches -OTC Asa 81 mg daily Lipitor to 40 mg daily Fasting Lipid levels 6 weeks Follow up 3 months        Medication Adjustments/Labs and Tests Ordered: Current medicines are reviewed at length with the patient today.  Concerns regarding medicines are outlined above.  Orders Placed This Encounter  Procedures   Lipid panel   EKG 12-Lead   Meds ordered this encounter  Medications   aspirin EC 81 MG tablet    Sig: Take 1 tablet (81 mg total) by mouth daily. Swallow whole.    Dispense:  30 tablet    Refill:  12   atorvastatin (LIPITOR) 20 MG tablet    Sig: Take 1 tablet (20 mg total) by mouth daily.    Dispense:  90 tablet    Refill:  3    Patient Instructions  Medication Instructions:  START: ASPIRIN  INCREASE LIPITOR TO '40mg'$  ONCE DAILY   PLEASE GET NICOTINE PATCHES OVER THE COUNTER- THERE ARE STEPS 1-3 *If you need a refill on your cardiac medications before your next appointment, please call your pharmacy*  Lab Work: Please return for FASTING Blood Work in [place]. No appointment needed, lab here at the office is open  Monday-Friday from 8AM to 4PM and closed daily for lunch from 12:45-1:45.   If you have labs (blood work) drawn today and your tests are completely normal, you will receive your results only by: [place] (if you have MyChart) OR A paper copy in the mail If you have any lab test that is abnormal or we need to change your treatment, we will call you to review the results.  Follow-Up: At [facility], you and your health needs are our priority.  As part of our continuing mission to provide you with exceptional heart care, we have created  designated Provider Care Teams.  These Care Teams include your primary Cardiologist (physician) and Advanced Practice Providers (APPs -  Physician Assistants and Nurse Practitioners) who all work together to provide you with the care you need, when you need it.  Your next appointment:   3 month(s)  The format for your next appointment:   In Person  Provider:   Janina Mayo, MD            Signed, Janina Mayo, MD  07/29/2021 4:21 PM    [place]

## 2021-09-09 LAB — LIPID PANEL
Chol/HDL Ratio: 2.2 ratio (ref 0.0–4.4)
Cholesterol, Total: 147 mg/dL (ref 100–199)
HDL: 68 mg/dL (ref >39)
LDL Chol Calc (NIH): 65 mg/dL (ref 0–99)
Triglycerides: 72 mg/dL (ref 0–149)
VLDL Cholesterol Cal: 14 mg/dL (ref 5–40)

## 2021-10-02 ENCOUNTER — Telehealth: Payer: Self-pay | Admitting: Internal Medicine

## 2021-10-02 MED ORDER — ATORVASTATIN CALCIUM 40 MG PO TABS: 40.0000 mg | ORAL_TABLET | Freq: Every day | ORAL | 3 refills | Status: AC

## 2021-10-02 NOTE — Telephone Encounter (Signed)
*  STAT* If patient is at the pharmacy, call can be transferred to refill team.   1. Which medications need to be refilled? (please list name of each medication and dose if known) atorvastatin (LIPITOR) 40 MG tablet  2. Which pharmacy/location (including street and city if local pharmacy) is medication to be sent to? [id number]- [place], [place] - [address]  3. Do they need a 30 day or 90 day supply? 978  Pt states the pharmacy has been giving her '20mg'$  tablets however her instructions are to take 1 '40mg'$  tablet. Pt is asking for clarification on if this was a mistake or if she should be taking 2, '20mg'$ .

## 2021-10-30 ENCOUNTER — Ambulatory Visit: Payer: 59 | Attending: Internal Medicine | Admitting: Internal Medicine

## 2021-10-30 VITALS — BP 136/88 | HR 93 | Ht 62.0 in | Wt 144.0 lb

## 2021-10-30 DIAGNOSIS — I2584 Coronary atherosclerosis due to calcified coronary lesion: Secondary | ICD-10-CM | POA: Diagnosis not present

## 2021-10-30 DIAGNOSIS — I2510 Atherosclerotic heart disease of native coronary artery without angina pectoris: Secondary | ICD-10-CM

## 2021-10-30 NOTE — Progress Notes (Signed)
Cardiology Office Note:    Date:  10/30/2021   ID:  Amy Patterson, DOB 09-10-1965, MRN [id number]  PCP:  Angelina Pih, MD   [facility] Providers Cardiologist:  Janina Mayo, MD     Referring MD: Angelina Pih, MD   No chief complaint on file. CAC/Palpitations  History of Present Illness:    Amy Patterson is a 56 y.o. female with a hx of smoking since she was teen, referral for palpitations and CAC.  She notes in March she developed PNA.  She had a chest xray in March that showed RML collapse/consolidation. Follow up CT chest did not show an obstructing mass. There was minimal chronic post pneumonic medial RML atelectasis. Incidentally found to have CAC.  She does routine activities and does not have chest pressure or SOB. She felt that her heart fluttered. She used [employer] which noted sinus tachycardia. She is still smoking, working on [employer]. She notes sedentary desk job. Parents have hypertension. No premature CAD.  No cardiology visits before ,no stress testing. Blood pressures are well controlled typically < 130/80 mmhg. She is on norvasc 2.5 mg daily.   Interim Hx 10/30/2021 She is cutting back on smoking.  No CP or DOE.   CT Chest w Contrast 06/24/2021 Cardiovascular: The heart is normal in size. No pericardial effusion. The aorta is normal in caliber. No dissection. Scattered atherosclerotic calcifications. The [place] vessels are patent. Moderate age advanced three-vessel coronary artery calcifications.  Past Medical History:  Diagnosis Date   Dysmenorrhea    Fibroid uterus    Menorrhagia     Past Surgical History:  Procedure Laterality Date   VAGINAL HYSTERECTOMY      Current Medications: Current Meds  Medication Sig   amLODipine (NORVASC) 5 MG tablet Take 5 mg by mouth daily.   aspirin EC 81 MG tablet Take 1 tablet (81 mg total) by mouth daily. Swallow whole.   atorvastatin (LIPITOR) 40 MG tablet Take 1 tablet (40 mg total) by mouth  daily.     Allergies:   Bupropion and Varenicline   Social History   Socioeconomic History   Marital status: Married    Spouse name: Not on file   Number of children: Not on file   Years of education: Not on file   Highest education level: Not on file  Occupational History   Not on file  Tobacco Use   Smoking status: Every Day    Types: Cigarettes   Smokeless tobacco: Never  Substance and Sexual Activity   Alcohol use: No   Drug use: No   Sexual activity: Yes    Birth control/protection: Surgical    Comment: HYST  Other Topics Concern   Not on file  Social History Narrative   Not on file   Social Determinants of Health   Financial Resource Strain: Not on file  Food Insecurity: Not on file  Transportation Needs: Not on file  Physical Activity: Not on file  Stress: Not on file  Social Connections: Not on file     Family History: The patient's family history is negative for Breast cancer.  ROS:   Please see the history of present illness.     All other systems reviewed and are negative.  EKGs/Labs/Other Studies Reviewed:    The following studies were reviewed today:   EKG:  EKG is  ordered today.  The ekg ordered today demonstrates   07/29/2021- NSR  , no significant ischemic changes  10/30/2021-   Recent  Labs: No results found for requested labs within last 365 days.   Recent Lipid Panel    Component Value Date/Time   CHOL 147 09/09/2021 0817   TRIG 72 09/09/2021 0817   HDL 68 09/09/2021 0817   CHOLHDL 2.2 09/09/2021 0817   LDLCALC 65 09/09/2021 0817     Risk Assessment/Calculations:           Physical Exam:    VS:   Vitals:   10/30/21 1546  BP: 136/88  Pulse: 93  SpO2: 99%     BP 136/88 (BP Location: Left Arm, Patient Position: Sitting, Cuff Size: Normal)   Pulse 93   Ht '5\' 2"'$  (1.575 m)   Wt 144 lb (65.3 kg)   LMP 11/29/2005   SpO2 99%   BMI 26.34 kg/m     Wt Readings from Last 3 Encounters:  10/30/21 144 lb (65.3 kg)   07/29/21 145 lb 6.4 oz (66 kg)  12/14/11 143 lb (64.9 kg)     GEN:  Well nourished, well developed in no acute distress HEENT: Normal NECK: No JVD;  LYMPHATICS: No lymphadenopathy CARDIAC: RRR, no murmurs, rubs, gallops RESPIRATORY:  Clear to auscultation without rales, wheezing or rhonchi  ABDOMEN: Soft, non-tender, non-distended MUSCULOSKELETAL:  No edema; No deformity  SKIN: Warm and dry NEUROLOGIC:  Alert and oriented x 3 PSYCHIATRIC:  Normal affect   ASSESSMENT:    Elevated CAC: Noted CAC on non-gated CT shows CAC, notable in the LAD. She is asymptomatic. We discussed symptoms of CAD including DOE and chest pressure. If these were to arise, will plan for further ischemic eval. Will institute preventive strategy for now. Continue  asa 81 mg daily. Increased her lipitor to 40 mg daily.  LDL 167 mg/dL. Repeat LDL on lipitor 40 mg daily is 65 mg/dL (at goal).  Palpitations: She does not have high risk features including syncope c/f arrhythmia , family hx of SCD, or abnormalities on her EKG. This was brief. We discussed cutting caffeine.   Smoking:  did not tolerate chantix or wellbutrin. She's tried patches before. recommend nicotine patches -OTC  HTN: well controlled at home.  Continue norvasc 5 mg daily   PLAN:    In order of problems listed above:   Follow up 12 months        Medication Adjustments/Labs and Tests Ordered: Current medicines are reviewed at length with the patient today.  Concerns regarding medicines are outlined above.  No orders of the defined types were placed in this encounter.  No orders of the defined types were placed in this encounter.   Patient Instructions  Medication Instructions:  Your physician recommends that you continue on your current medications as directed. Please refer to the Current Medication list given to you today.  *If you need a refill on your cardiac medications before your next appointment, please call your  pharmacy*   Follow-Up: At [facility], you and your health needs are our priority.  As part of our continuing mission to provide you with exceptional heart care, we have created designated Provider Care Teams.  These Care Teams include your primary Cardiologist (physician) and Advanced Practice Providers (APPs -  Physician Assistants and Nurse Practitioners) who all work together to provide you with the care you need, when you need it.  We recommend signing up for the patient portal called "MyChart".  Sign up information is provided on this After Visit Summary.  MyChart is used to connect with patients for Virtual Visits (Telemedicine).  Patients are able to view lab/test results, encounter notes, upcoming appointments, etc.  Non-urgent messages can be sent to your provider as well.   To learn more about what you can do with MyChart, go to [email or web address].    Your next appointment:   12 month(s)  The format for your next appointment:   In Person  Provider:   Janina Mayo, MD    Signed, Janina Mayo, MD  10/30/2021 4:31 PM    [place]

## 2021-10-30 NOTE — Patient Instructions (Signed)
Medication Instructions:  Your physician recommends that you continue on your current medications as directed. Please refer to the Current Medication list given to you today.  *If you need a refill on your cardiac medications before your next appointment, please call your pharmacy*   Follow-Up: At [facility], you and your health needs are our priority.  As part of our continuing mission to provide you with exceptional heart care, we have created designated Provider Care Teams.  These Care Teams include your primary Cardiologist (physician) and Advanced Practice Providers (APPs -  Physician Assistants and Nurse Practitioners) who all work together to provide you with the care you need, when you need it.  We recommend signing up for the patient portal called "MyChart".  Sign up information is provided on this After Visit Summary.  MyChart is used to connect with patients for Virtual Visits (Telemedicine).  Patients are able to view lab/test results, encounter notes, upcoming appointments, etc.  Non-urgent messages can be sent to your provider as well.   To learn more about what you can do with MyChart, go to [email or web address].    Your next appointment:   12 month(s)  The format for your next appointment:   In Person  Provider:   Janina Mayo, MD

## 2022-01-12 ENCOUNTER — Other Ambulatory Visit: Payer: Self-pay | Admitting: Family Medicine

## 2022-01-12 DIAGNOSIS — Z1231 Encounter for screening mammogram for malignant neoplasm of breast: Secondary | ICD-10-CM

## 2022-01-13 ENCOUNTER — Ambulatory Visit
Admission: RE | Admit: 2022-01-13 | Discharge: 2022-01-13 | Disposition: A | Discharge status: Home / Self Care | Payer: 59 | Source: Ambulatory Visit | Attending: Family Medicine | Admitting: Family Medicine

## 2022-01-13 DIAGNOSIS — Z1231 Encounter for screening mammogram for malignant neoplasm of breast: Secondary | ICD-10-CM

## 2022-11-17 ENCOUNTER — Ambulatory Visit: Payer: BLUE CROSS/BLUE SHIELD | Attending: Internal Medicine | Admitting: Internal Medicine

## 2022-11-17 ENCOUNTER — Encounter: Payer: Self-pay | Admitting: Internal Medicine

## 2022-11-17 VITALS — BP 110/68 | HR 101 | Ht 62.0 in | Wt 143.6 lb

## 2022-11-17 DIAGNOSIS — I2510 Atherosclerotic heart disease of native coronary artery without angina pectoris: Secondary | ICD-10-CM

## 2022-11-17 NOTE — Progress Notes (Signed)
Cardiology Office Note:    Date:  11/17/2022   ID:  Amy Patterson, DOB 12/20/1965, MRN [id number]  PCP:  Carolin Coy, MD   [facility] Providers Cardiologist:  Maisie Fus, MD     Referring MD: Carolin Coy, MD   No chief complaint on file. CAC/Palpitations  History of Present Illness:    Amy Patterson is a 57 y.o. female with a hx of smoking since she was teen, referral for palpitations and CAC.  She notes in March she developed PNA.  She had a chest xray in March that showed RML collapse/consolidation. Follow up CT chest did not show an obstructing mass. There was minimal chronic post pneumonic medial RML atelectasis. Incidentally found to have CAC.  She does routine activities and does not have chest pressure or SOB. She felt that her heart fluttered. She used [employer] which noted sinus tachycardia. She is still smoking, working on [employer]. She notes sedentary desk job. Parents have hypertension. No premature CAD.  No cardiology visits before ,no stress testing. Blood pressures are well controlled typically < 130/80 mmhg. She is on norvasc 2.5 mg daily.   Interim Hx 10/30/2021 She is cutting back on smoking.  No CP or DOE.   Interval 11/17/2022 She is doing well. Still has mild sinus tachycardia. She has cut back on caffeine. She is cutting back on smoking.   Past Medical History:  Diagnosis Date   Dysmenorrhea    Fibroid uterus    Menorrhagia     Past Surgical History:  Procedure Laterality Date   VAGINAL HYSTERECTOMY      Current Medications: No outpatient medications have been marked as taking for the 11/17/22 encounter (Appointment) with Maisie Fus, MD.     Allergies:   Bupropion and Varenicline   Social History   Socioeconomic History   Marital status: Married    Spouse name: Not on file   Number of children: Not on file   Years of education: Not on file   Highest education level: Not on file  Occupational History   Not  on file  Tobacco Use   Smoking status: Every Day    Types: Cigarettes   Smokeless tobacco: Never  Substance and Sexual Activity   Alcohol use: No   Drug use: No   Sexual activity: Yes    Birth control/protection: Surgical    Comment: HYST  Other Topics Concern   Not on file  Social History Narrative   Not on file   Social Determinants of Health   Financial Resource Strain: Not on file  Food Insecurity: Not on file  Transportation Needs: Not on file  Physical Activity: Not on file  Stress: Not on file  Social Connections: Not on file     Family History: The patient's family history is negative for Breast cancer.  ROS:   Please see the history of present illness.     All other systems reviewed and are negative.  EKGs/Labs/Other Studies Reviewed:    The following studies were reviewed today:   EKG:  EKG is  ordered today.  The ekg ordered today demonstrates   07/29/2021- NSR  , no significant ischemic changes    EKG Interpretation Date/Time:  Wednesday November 17 2022 16:03:14 EDT Ventricular Rate:  101 PR Interval:  130 QRS Duration:  82 QT Interval:  358 QTC Calculation: 464 R Axis:   16  Text Interpretation: Sinus tachycardia Nonspecific T wave abnormality No previous ECGs available Confirmed by Wyline Mood,  Jakaylah Schlafer [phone]) on 11/17/2022 4:05:53 PM   CT Chest w Contrast 06/24/2021 Cardiovascular: The heart is normal in size. No pericardial effusion. The aorta is normal in caliber. No dissection. Scattered atherosclerotic calcifications. The [place]lin Gardella vessels are patent. Moderate age advanced three-vessel coronary artery calcifications.  Recent Labs: No results found for requested labs within last 365 days.   Recent Lipid Panel    Component Value Date/Time   CHOL 147 09/09/2021 0817   TRIG 72 09/09/2021 0817   HDL 68 09/09/2021 0817   CHOLHDL 2.2 09/09/2021 0817   LDLCALC 65 09/09/2021 0817     Risk Assessment/Calculations:           Physical Exam:     VS:   Vitals:   11/17/22 1557  BP: 110/68  Pulse: (!) 101  SpO2: 96%     LMP 11/29/2005     Wt Readings from Last 3 Encounters:  10/30/21 144 lb (65.3 kg)  07/29/21 145 lb 6.4 oz (66 kg)  12/14/11 143 lb (64.9 kg)     GEN:  Well nourished, well developed in no acute distress HEENT: Normal NECK: No JVD;  LYMPHATICS: No lymphadenopathy CARDIAC: RRR, no murmurs, rubs, gallops RESPIRATORY:  Clear to auscultation without rales, wheezing or rhonchi  ABDOMEN: Soft, non-tender, non-distended MUSCULOSKELETAL:  No edema; No deformity  SKIN: Warm and dry NEUROLOGIC:  Alert and oriented x 3 PSYCHIATRIC:  Normal affect   ASSESSMENT:    Elevated CAC: Noted CAC on non-gated CT shows CAC, notable in the LAD. She is asymptomatic. We discussed symptoms of CAD including DOE and chest pressure. If these were to arise, will plan for further ischemic eval. Will institute preventive strategy for now. Continue  asa 81 mg daily. Increased her lipitor to 40 mg daily.  LDL 167 mg/dL. Repeat LDL on lipitor 40 mg daily is 65 mg/dL (at goal).  Palpitations/Sinus tachycardia: She does not have high risk features including syncope c/f arrhythmia , family hx of SCD, or abnormalities on her EKG. This was brief. We discussed cutting caffeine and smoking.  Smoking:  did not tolerate chantix or wellbutrin. She's tried patches before. recommend nicotine patches -OTC  HTN: well controlled at home.  Continue norvasc 5 mg daily   PLAN:    In order of problems listed above:    Follow up 12 months        Medication Adjustments/Labs and Tests Ordered: Current medicines are reviewed at length with the patient today.  Concerns regarding medicines are outlined above.  No orders of the defined types were placed in this encounter.  No orders of the defined types were placed in this encounter.   There are no Patient Instructions on file for this visit.   Signed, Maisie Fus, MD  11/17/2022 3:48 PM     [facility]

## 2022-11-17 NOTE — Patient Instructions (Signed)
Follow-Up: At [facility], you and your health needs are our priority.  As part of our continuing mission to provide you with exceptional heart care, we have created designated Provider Care Teams.  These Care Teams include your primary Cardiologist (physician) and Advanced Practice Providers (APPs -  Physician Assistants and Nurse Practitioners) who all work together to provide you with the care you need, when you need it.  We recommend signing up for the patient portal called "MyChart".  Sign up information is provided on this After Visit Summary.  MyChart is used to connect with patients for Virtual Visits (Telemedicine).  Patients are able to view lab/test results, encounter notes, upcoming appointments, etc.  Non-urgent messages can be sent to your provider as well.   To learn more about what you can do with MyChart, go to [email or web address].    Your next appointment:   1 year(s)  The format for your next appointment:   In Person  Provider:   Maisie Fus, MD

## 2022-12-18 IMAGING — CT CT CHEST W/ CM
2 of 4 series · 15 of 36 positions shown, 18 images · IV contrast (APPLIED)
Comparison: Chest x-ray 05/06/2021

CLINICAL DATA: Followup abnormal chest x-ray.

EXAM:
CT CHEST WITH CONTRAST
TECHNIQUE: Multidetector CT imaging of the chest was performed during
intravenous contrast administration.

[Series 2: routine chest with · axial · 0.70mm/px · z∈[+1229,+1493]mm · 12 of 157 slices shown, 15 images]
[im 13/157  mediastinal]
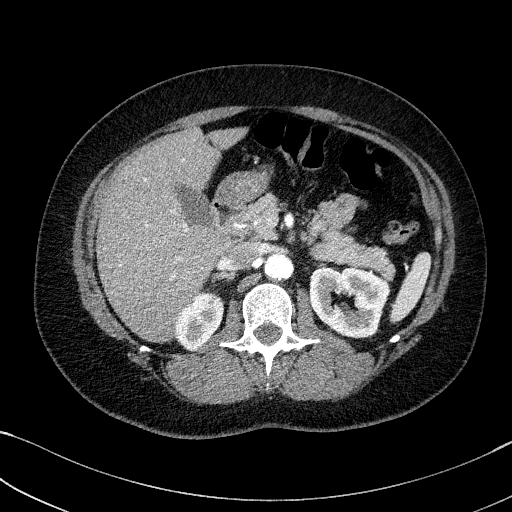
[im 13/157  lung]
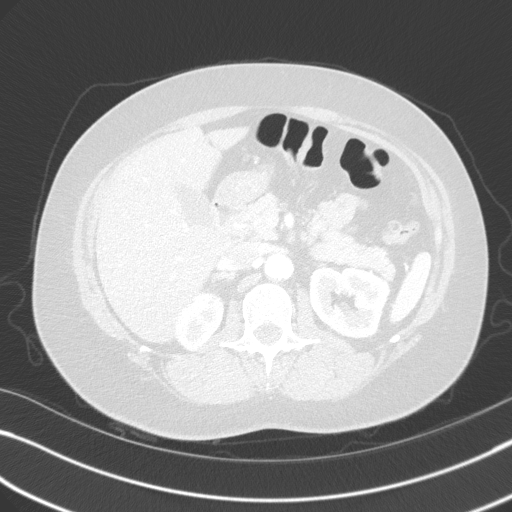
[im 25/157  lung]
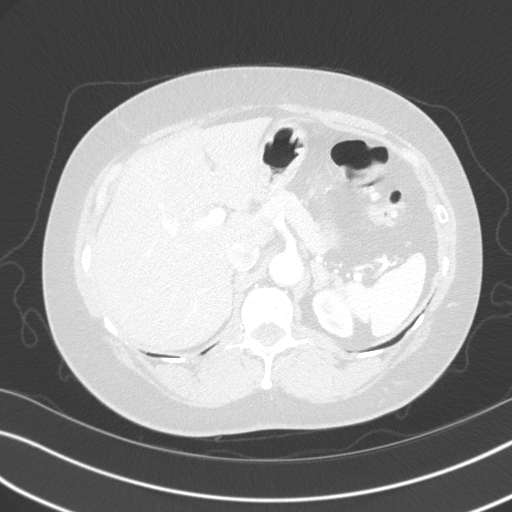
[im 37/157  lung]
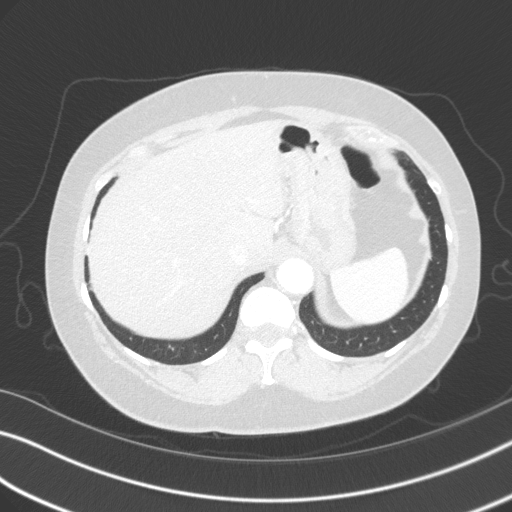
[im 49/157  lung]
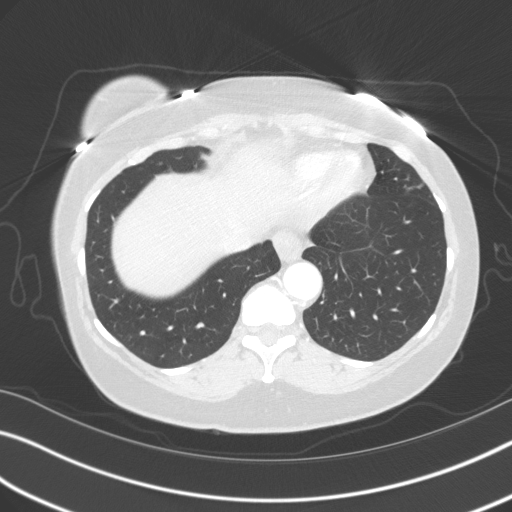
[im 61/157  mediastinal]
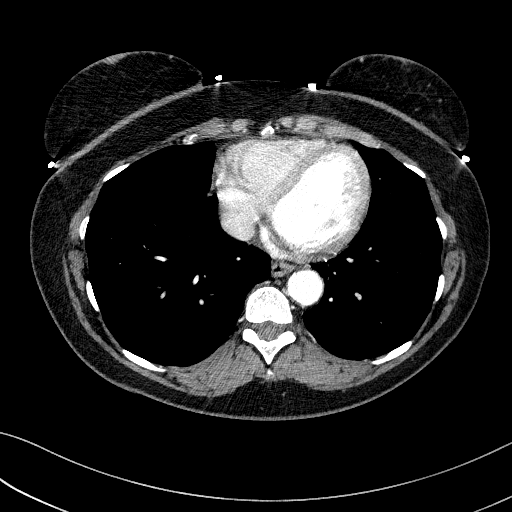
[im 61/157  lung]
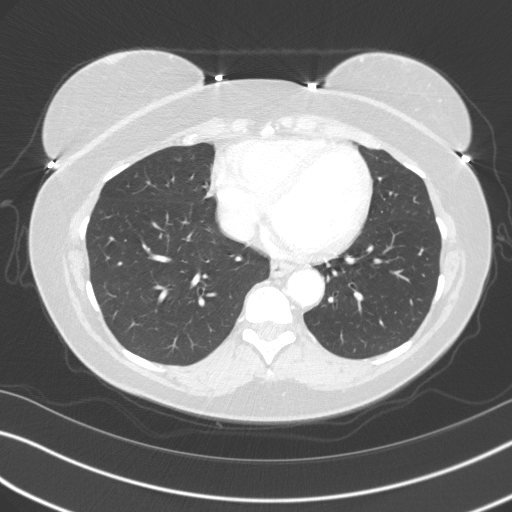
[im 73/157  lung]
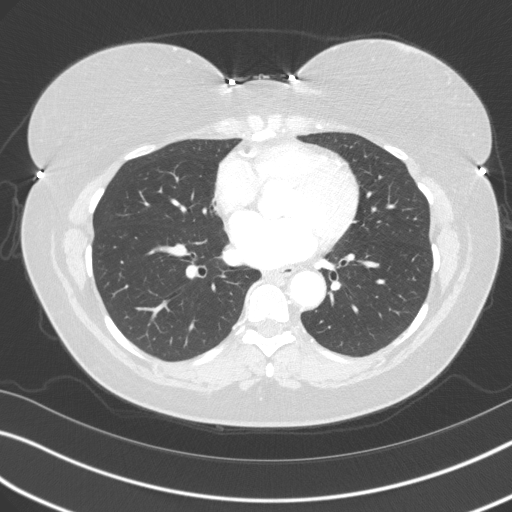
[im 85/157  lung]
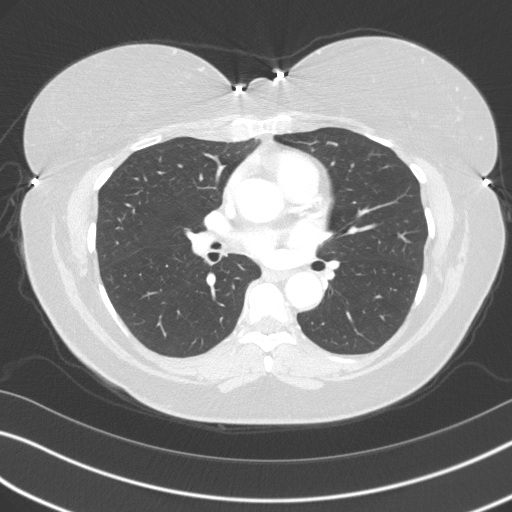
[im 97/157  lung]
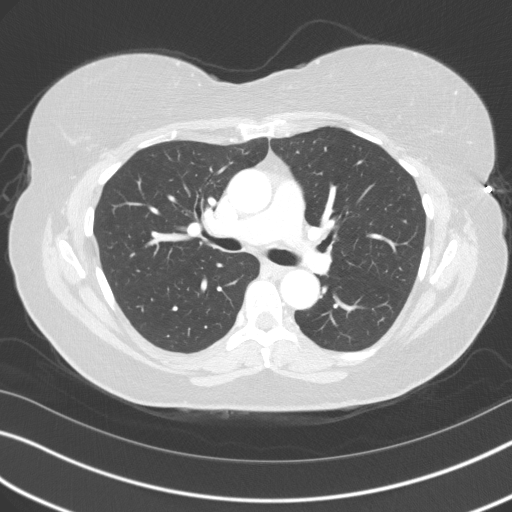
[im 109/157  mediastinal]
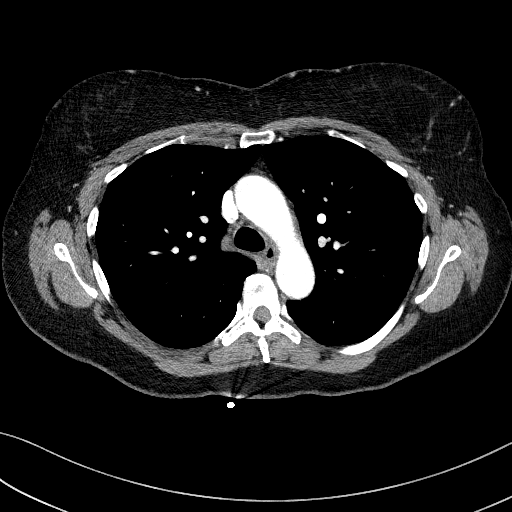
[im 109/157  lung]
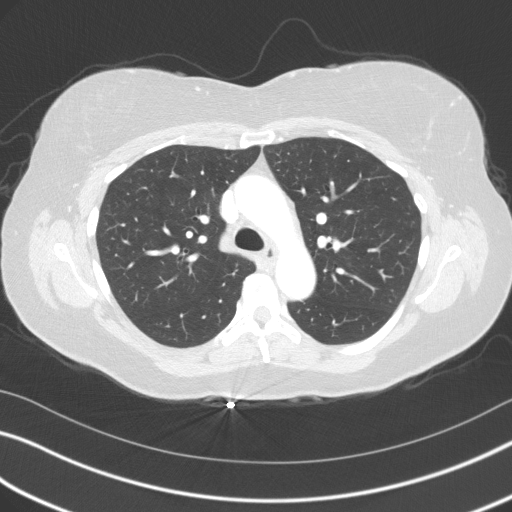
[im 121/157  lung]
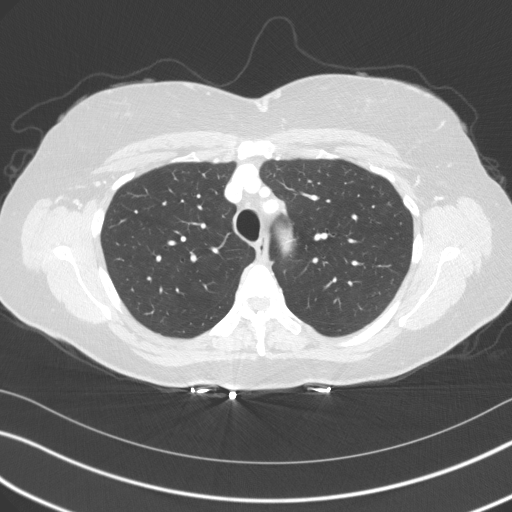
[im 133/157  lung]
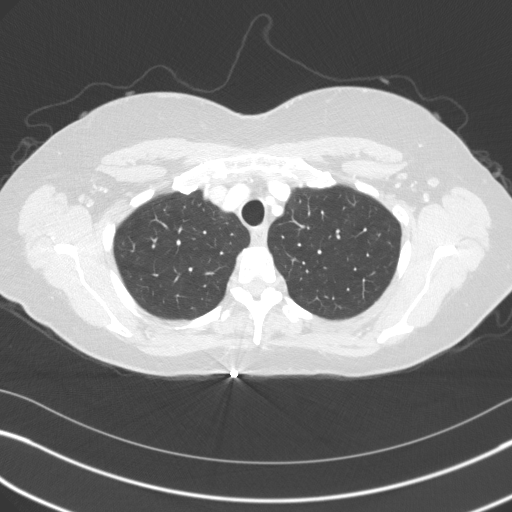
[im 145/157  lung]
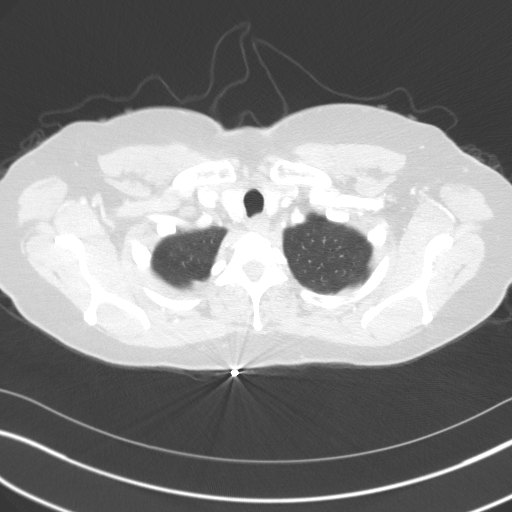

[Series 5: coronal · coronal · 0.63mm/px · 3 of 142 slices shown]
[im 29/142  lung]
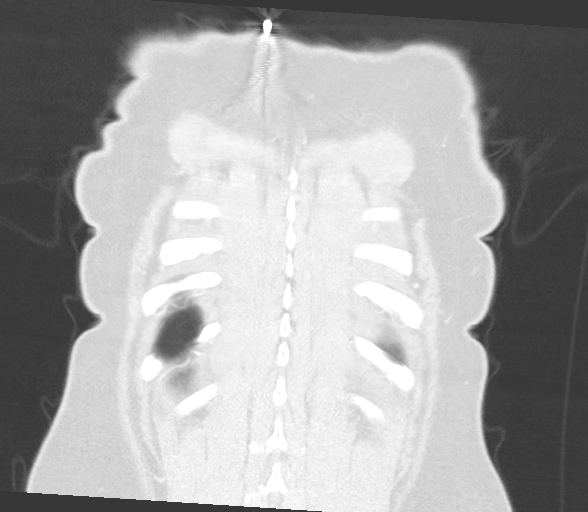
[im 57/142  lung]
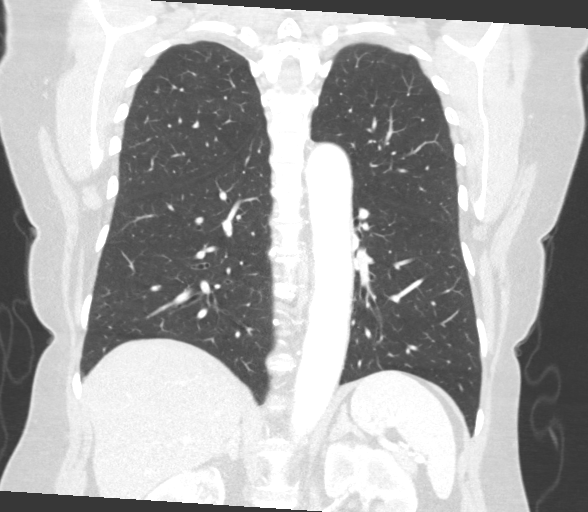
[im 85/142  lung]
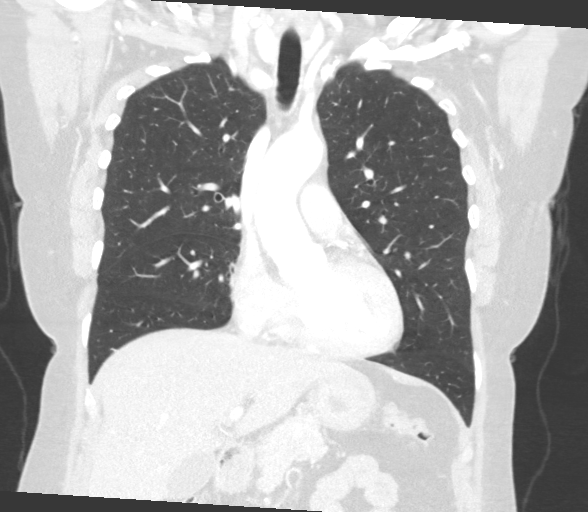

[15 of 36 positions shown; findings below may reference images not displayed]

RADIATION DOSE REDUCTION: This exam was performed according to the
departmental dose-optimization program which includes automated
exposure control, adjustment of the mA and/or kV according to
patient size and/or use of iterative reconstruction technique.

CONTRAST:  80mL OMNIPAQUE IOHEXOL 300 MG/ML  SOLN
FINDINGS: Cardiovascular: The heart is normal in size. No pericardial
effusion. The aorta is normal in caliber. No dissection. Scattered
atherosclerotic calcifications. The branch vessels are patent.
Moderate age advanced three-vessel coronary artery calcifications.

Mediastinum/Nodes: No mediastinal or hilar mass or lymphadenopathy.
The esophagus is grossly normal. There is a small hiatal hernia. The
thyroid gland is unremarkable.

Lungs/Pleura: The lungs are clear. No persistent infiltrate. No
pulmonary lesions or pulmonary nodules. No interstitial lung disease
or bronchiectasis. The central tracheobronchial tree is
unremarkable. Minimal chronic post pneumonic medial right middle
lobe atelectasis or scarring.

Upper Abdomen: No significant upper abdominal findings.

Musculoskeletal: No breast masses, supraclavicular or axillary
adenopathy. The bony thorax is intact.
IMPRESSION: 1. No acute pulmonary findings, pulmonary lesions or pulmonary
nodules.
2. Minimal post pneumonic medial right middle lobe atelectasis or
scarring.
3. No mediastinal or hilar mass or adenopathy.
4. Age advanced three-vessel coronary artery calcifications.
5. Small hiatal hernia.
6. Aortic atherosclerosis.

Aortic Atherosclerosis ([id number]).

## 2023-07-28 ENCOUNTER — Other Ambulatory Visit: Payer: Self-pay | Admitting: Family Medicine

## 2023-07-28 DIAGNOSIS — Z1231 Encounter for screening mammogram for malignant neoplasm of breast: Secondary | ICD-10-CM

## 2023-07-29 ENCOUNTER — Ambulatory Visit
Admission: RE | Admit: 2023-07-29 | Discharge: 2023-07-29 | Disposition: A | Discharge status: Home / Self Care | Source: Ambulatory Visit | Attending: Family Medicine | Admitting: Family Medicine

## 2023-07-29 DIAGNOSIS — Z1231 Encounter for screening mammogram for malignant neoplasm of breast: Secondary | ICD-10-CM
